# Patient Record
Sex: Female | Born: 2000 | Race: White | Hispanic: No | Marital: Single | Smoking: Never smoker
Health system: Southern US, Community
[De-identification: ages and names within clinical notes are randomized; demographics above are authoritative.]

## PROBLEM LIST (undated history)

## (undated) DIAGNOSIS — J45909 Unspecified asthma, uncomplicated: Secondary | ICD-10-CM

## (undated) DIAGNOSIS — R569 Unspecified convulsions: Secondary | ICD-10-CM

## (undated) HISTORY — PX: ADENOIDECTOMY: SUR15

## (undated) HISTORY — PX: TONSILLECTOMY: SUR1361

---
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
## 2018-07-12 IMAGING — CR DG HIP (WITH OR WITHOUT PELVIS) 2-3V*L*
2 series · 2 of 2 positions shown · non-contrast
Comparison: None.

CLINICAL DATA: The patient heard a pop in the left hip while
swinging a bat 1.5 weeks ago with onset of pain. Initial encounter.

EXAM:
DG HIP (WITH OR WITHOUT PELVIS) 2-3V LEFT

[hip ap]
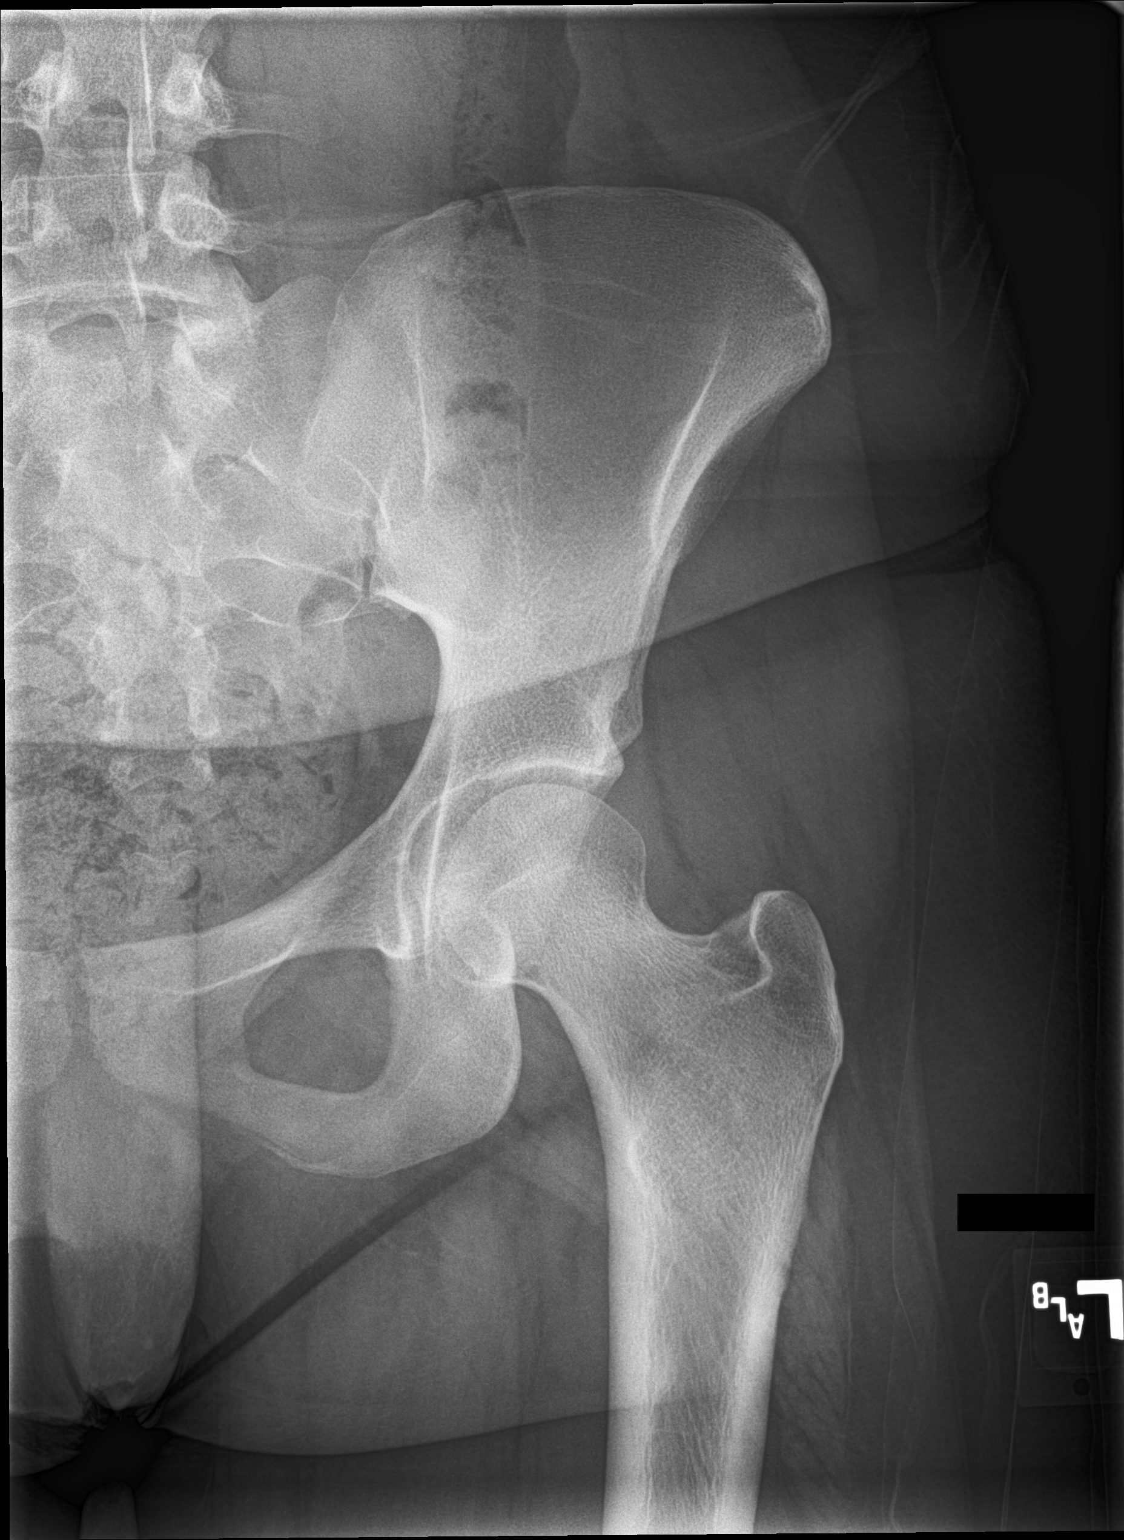

[hip lat]
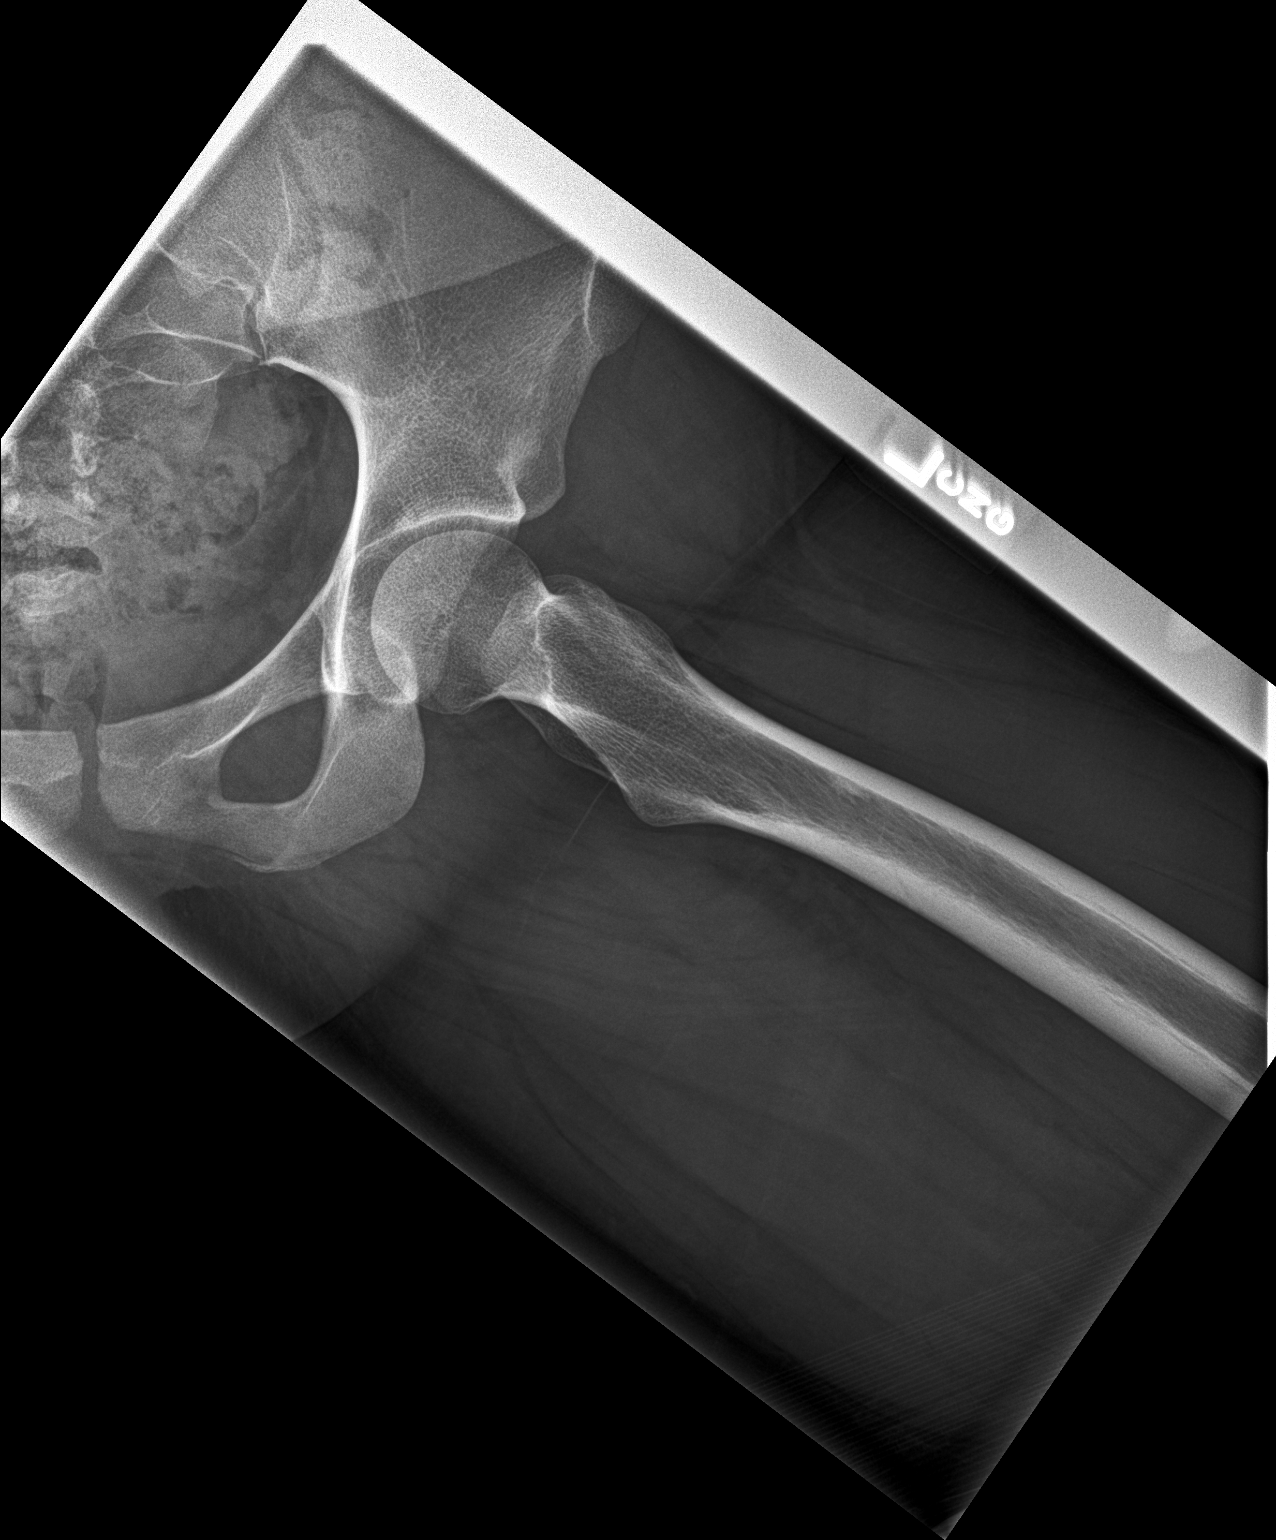

[2 of 2 positions shown; findings below may reference images not displayed]

FINDINGS: There is no evidence of hip fracture or dislocation. There is no
evidence of arthropathy or other focal bone abnormality.
IMPRESSION: Normal exam.

## 2018-07-12 IMAGING — CR DG HIP (WITH OR WITHOUT PELVIS) 2-3V*R*
3 series · 3 of 3 positions shown · non-contrast
Comparison: No recent.

CLINICAL DATA: Scratched it bilateral hip pops.  Possible injury.

EXAM:
DG HIP (WITH OR WITHOUT PELVIS) 2-3V RIGHT

[pelvis ap]
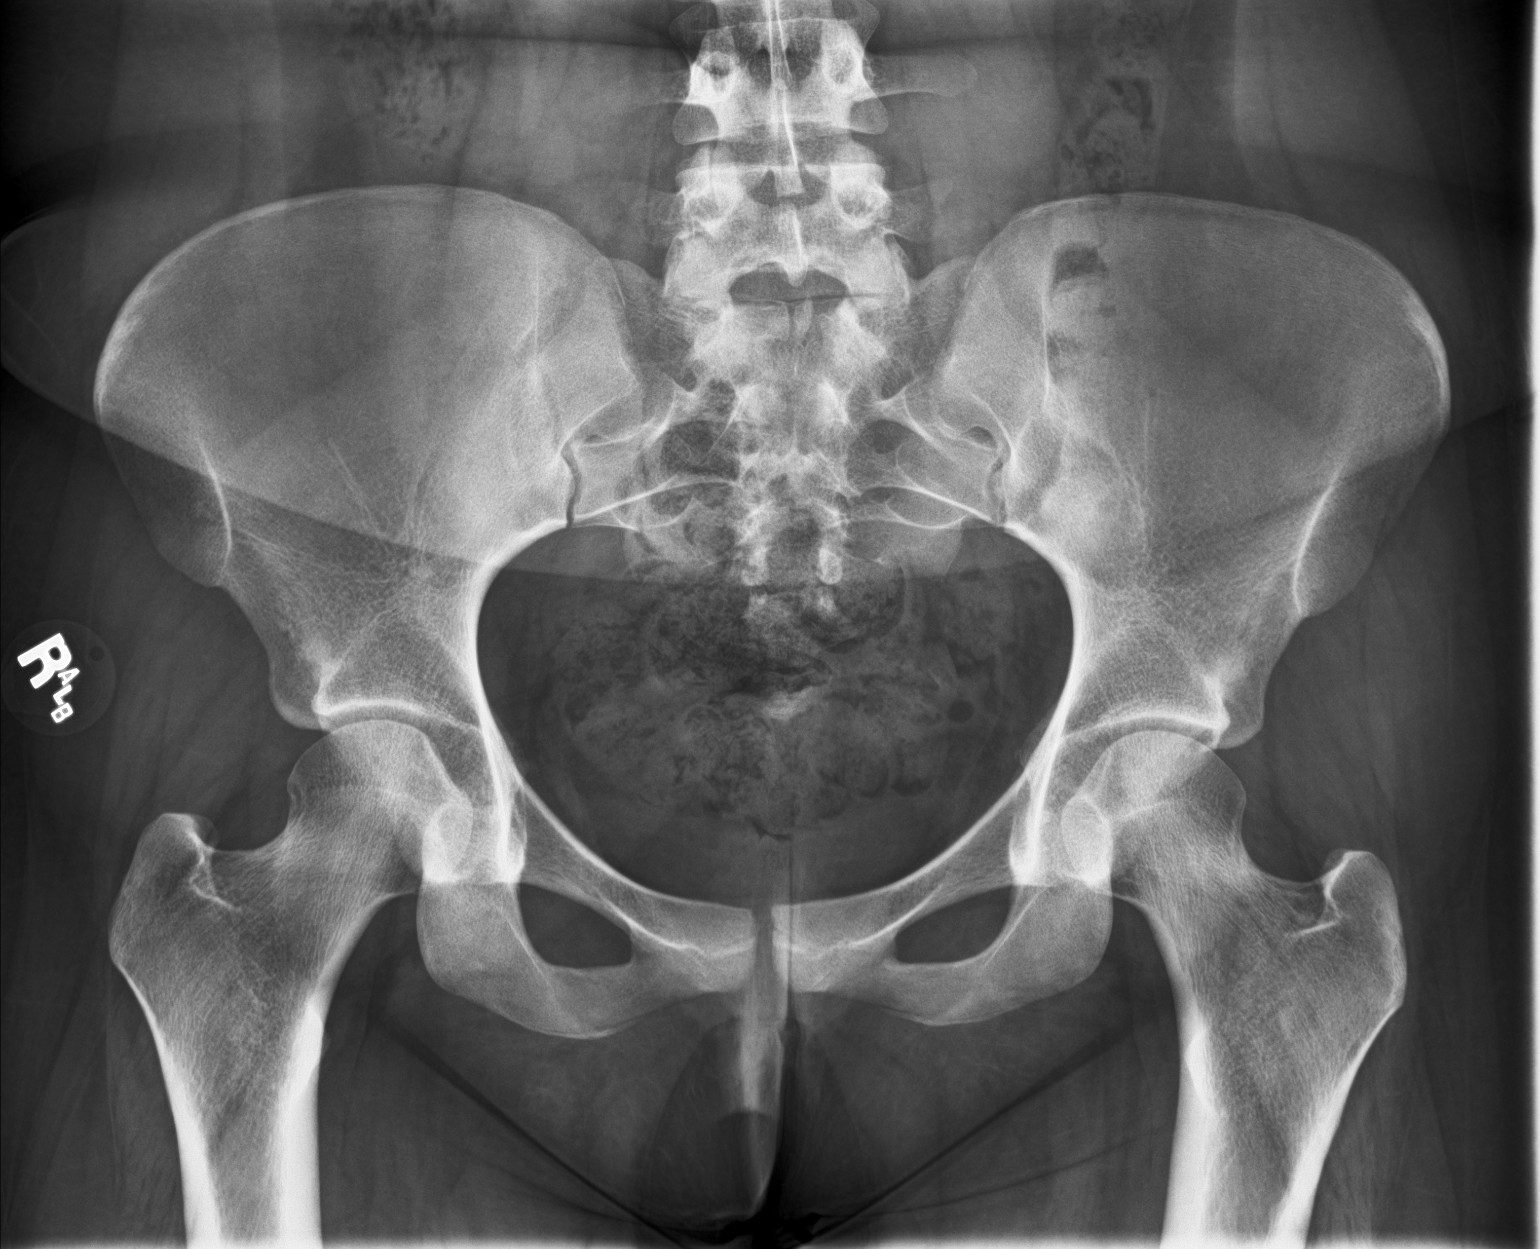

[hip ap]
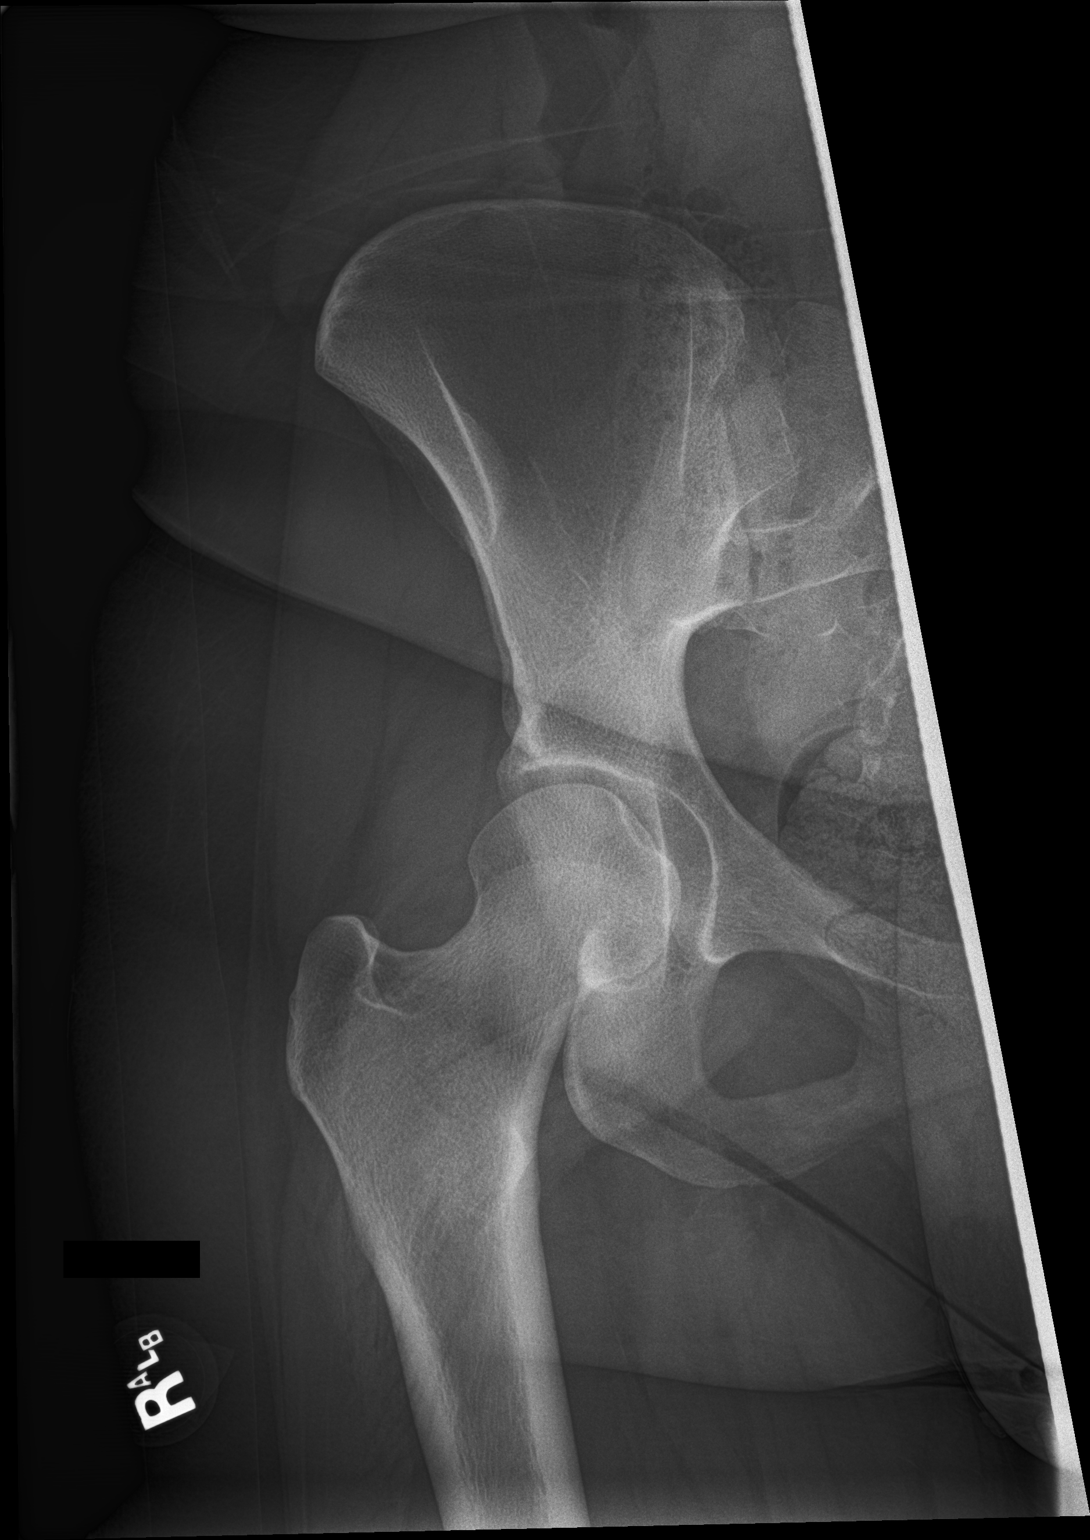

[hip lat]
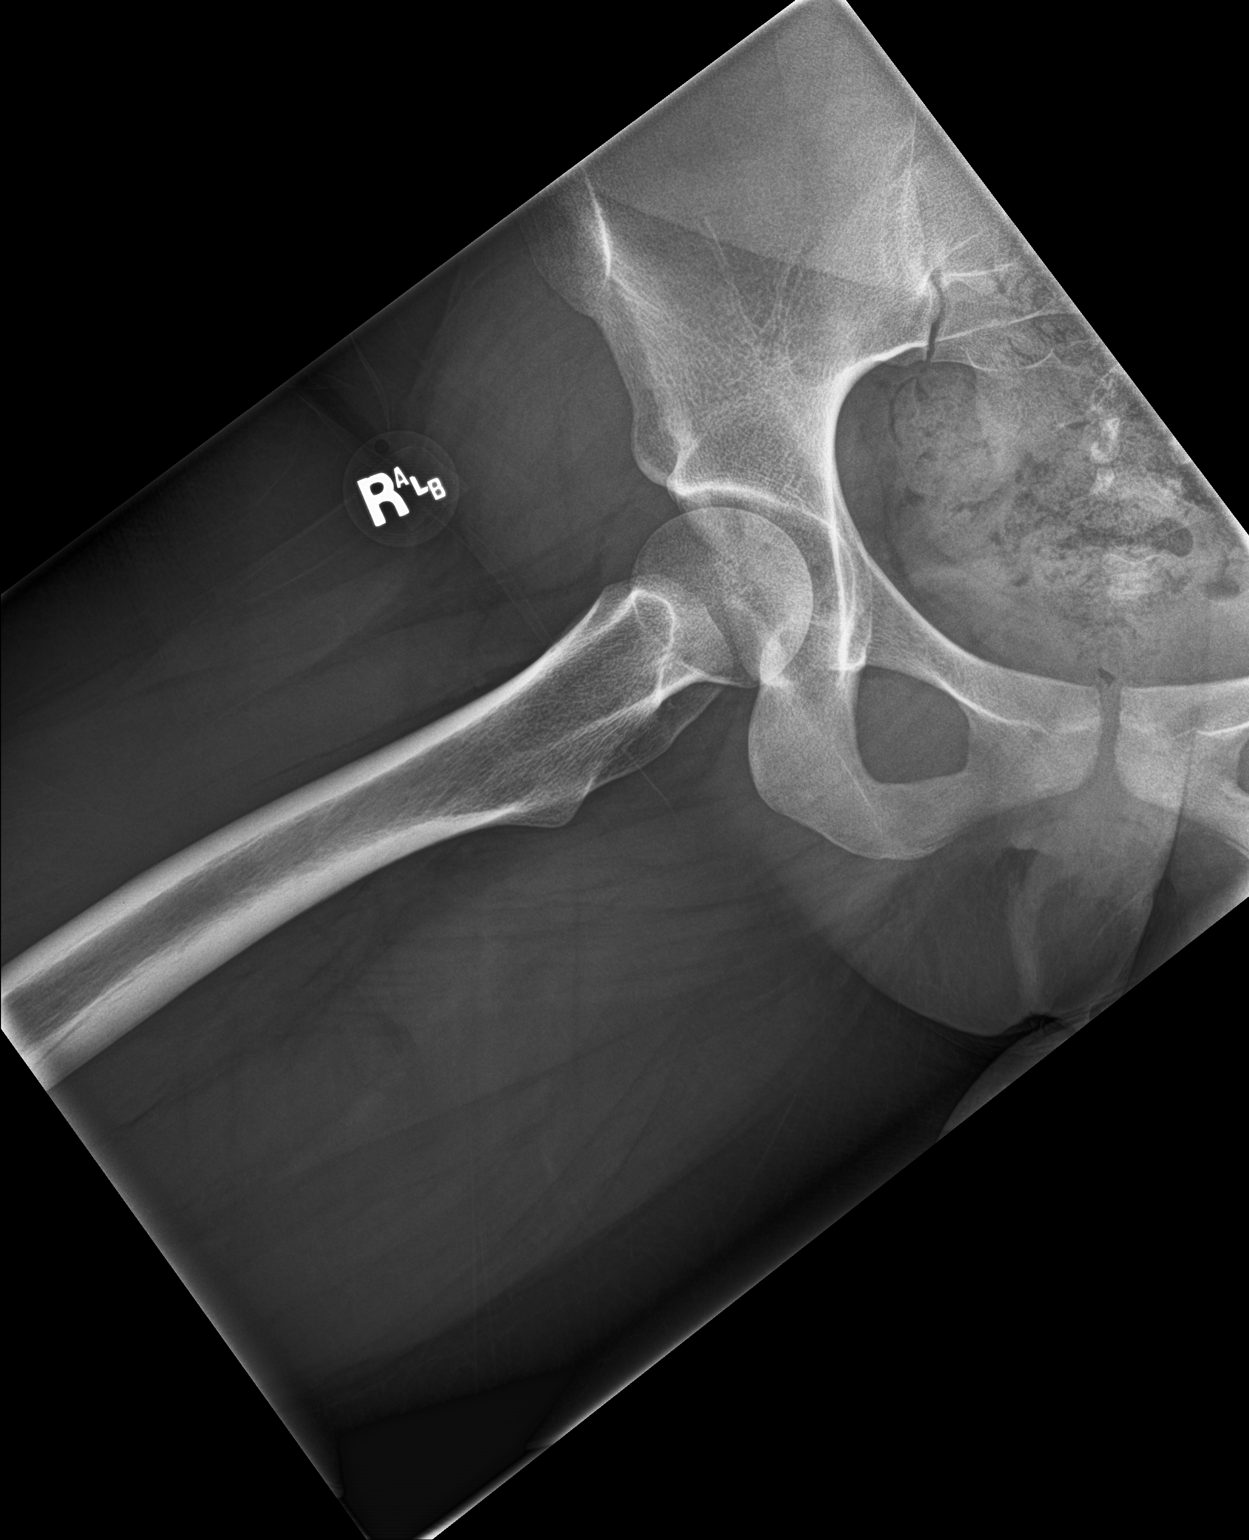

[3 of 3 positions shown; findings below may reference images not displayed]

FINDINGS: No acute soft tissue bony abnormality. No evidence of fracture. No
evidence of dislocation.
IMPRESSION: No acute or focal abnormality.

## 2019-12-12 ENCOUNTER — Ambulatory Visit: Payer: Self-pay | Admitting: Dermatology
